# Patient Record
Sex: Male | Born: 1990 | Race: Black or African American | Hispanic: No | Marital: Single | Smoking: Former smoker
Health system: Southern US, Community
[De-identification: ages and names within clinical notes are randomized; demographics above are authoritative.]

## PROBLEM LIST (undated history)

## (undated) DIAGNOSIS — J45909 Unspecified asthma, uncomplicated: Secondary | ICD-10-CM

## (undated) HISTORY — PX: NO PAST SURGERIES: SHX2092

---
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
## 2010-06-03 ENCOUNTER — Ambulatory Visit: Payer: Self-pay | Admitting: Internal Medicine

## 2013-06-07 ENCOUNTER — Emergency Department: Payer: Self-pay | Admitting: Emergency Medicine

## 2014-05-29 ENCOUNTER — Other Ambulatory Visit: Payer: Self-pay

## 2014-05-29 ENCOUNTER — Emergency Department: Payer: Self-pay

## 2014-05-29 ENCOUNTER — Encounter: Payer: Self-pay | Admitting: Emergency Medicine

## 2014-05-29 ENCOUNTER — Emergency Department
Admission: EM | Admit: 2014-05-29 | Discharge: 2014-05-29 | Disposition: A | Discharge status: Home / Self Care | Payer: Self-pay | Attending: Emergency Medicine | Admitting: Emergency Medicine

## 2014-05-29 DIAGNOSIS — Z720 Tobacco use: Secondary | ICD-10-CM | POA: Insufficient documentation

## 2014-05-29 DIAGNOSIS — R0789 Other chest pain: Secondary | ICD-10-CM | POA: Insufficient documentation

## 2014-05-29 DIAGNOSIS — J4520 Mild intermittent asthma, uncomplicated: Secondary | ICD-10-CM

## 2014-05-29 DIAGNOSIS — J45909 Unspecified asthma, uncomplicated: Secondary | ICD-10-CM | POA: Insufficient documentation

## 2014-05-29 DIAGNOSIS — Z79899 Other long term (current) drug therapy: Secondary | ICD-10-CM | POA: Insufficient documentation

## 2014-05-29 HISTORY — DX: Unspecified asthma, uncomplicated: J45.909

## 2014-05-29 MED ORDER — BENZONATATE 100 MG PO CAPS: 100.0000 mg | ORAL_CAPSULE | Freq: Three times a day (TID) | ORAL | Status: DC | PRN

## 2014-05-29 MED ORDER — ALBUTEROL SULFATE HFA 108 (90 BASE) MCG/ACT IN AERS: 2.0000 | INHALATION_SPRAY | RESPIRATORY_TRACT | Status: DC | PRN

## 2014-05-29 MED ORDER — FLUTICASONE PROPIONATE 50 MCG/ACT NA SUSP: 1.0000 | Freq: Every day | NASAL | Status: DC

## 2014-05-29 MED ORDER — IPRATROPIUM-ALBUTEROL 0.5-2.5 (3) MG/3ML IN SOLN
3.0000 mL | Freq: Once | RESPIRATORY_TRACT | Status: AC
  Administered 2014-05-29: 3 mL via RESPIRATORY_TRACT

## 2014-05-29 MED ORDER — IPRATROPIUM-ALBUTEROL 0.5-2.5 (3) MG/3ML IN SOLN
RESPIRATORY_TRACT | Status: AC
  Filled 2014-05-29: qty 3

## 2014-05-29 NOTE — ED Provider Notes (Signed)
[facility] Emergency Department Provider Note? ____________________________________________ ? Time seen: 1015 ? I have reviewed the triage vital signs and the nursing notes. ________ HISTORY ? Chief Complaint Asthma  HPI  Randy Fujitaroy E Wolgamott Jr. is a 24 y.o. male reports to the ED with complaints of cough and chest tightness with shortness of breath since yesterday. Patient gives a remote history of asthma for which he has kept prn albuterol inhaler. He denies fevers, chills, sweats, nausea, vomiting, but does report a productive cough at this time.  Review of Systems  Constitutional: Negative for fever. Eyes: Negative for visual changes. ENT: Negative for sore throat. Cardiovascular: Negative for chest pain. Respiratory: Negative for shortness of breath. Gastrointestinal: Negative for abdominal pain, vomiting and diarrhea. Musculoskeletal: Negative for back pain. Skin: Negative for rash. Neurological: Negative for headaches, focal weakness or numbness.  10-point ROS otherwise negative. ____________________________________________  Past Medical History  Diagnosis Date  . Asthma    There are no active problems to display for this patient. ? History reviewed. No pertinent past surgical history. ? Current Outpatient Rx  Name  Route  Sig  Dispense  Refill  . albuterol (PROVENTIL HFA;VENTOLIN HFA) 108 (90 BASE) MCG/ACT inhaler   Inhalation   Inhale 1 puff into the lungs every 6 (six) hours as needed for wheezing or shortness of breath.         Marland Kitchen. albuterol (PROVENTIL HFA;VENTOLIN HFA) 108 (90 BASE) MCG/ACT inhaler   Inhalation   Inhale 2 puffs into the lungs every 4 (four) hours as needed for wheezing or shortness of breath.   1 Inhaler   0   . benzonatate (TESSALON PERLES) 100 MG capsule   Oral   Take 1 capsule (100 mg total) by mouth 3 (three) times daily as needed for cough (Take 1-2 per dose).   30 capsule   0   . fluticasone (FLONASE) 50  MCG/ACT nasal spray   Each Nare   Place 1 spray into both nostrils daily.   16 g   0   ? Allergies Review of patient's allergies indicates no known allergies. ? History reviewed. No pertinent family history. ? Social History History  Substance Use Topics  . Smoking status: Current Every Day Smoker -- 0.00 packs/day    Types: Cigarettes  . Smokeless tobacco: Not on file  . Alcohol Use: Not on file   PHYSICAL EXAM:  VITAL SIGNS: ED Triage Vitals  Enc Vitals Group     BP 05/29/14 0950 154/93 mmHg     Pulse Rate 05/29/14 0950 125     Resp 05/29/14 0950 20     Temp 05/29/14 0950 98 F (36.7 C)     Temp Source 05/29/14 0950 Oral     SpO2 05/29/14 0950 94 %     Weight 05/29/14 0950 183 lb (83.008 kg)     Height 05/29/14 0950 6\' 5"  (1.956 m)     Head Cir --      Peak Flow --      Pain Score 05/29/14 0951 10     Pain Loc --      Pain Edu? --      Excl. in GC? --    Constitutional: Alert and oriented. Well appearing and in no distress. Eyes: Conjunctivae are normal. PERRL. Normal extraocular movements. ENT   Head: Normocephalic and atraumatic.   Nose: No congestion/rhinnorhea.   Mouth/Throat: Mucous membranes are moist.      Ears: Normal external exam. Canals clear. TMs clear  bilaterally.   Neck: Supple. No lymphadenopathy. Cardiovascular: Normal rate, regular rhythm. Normal and symmetric distal pulses are present in all extremities. No murmurs, rubs, or gallops. Respiratory: Normal respiratory effort without tachypnea nor retractions. Breath sounds are clear and equal bilaterally. No wheezes/rales/rhonchi. Gastrointestinal: Soft and nontender.  Musculoskeletal: Normal range of motion in all extremities.  Neurologic:  Normal speech and language. No gross focal neurologic deficits are appreciated.  Skin:  Skin is warm, dry and intact. No rash noted. Psychiatric: Mood and affect are normal. Patient exhibits appropriate insight and judgment.  ___ EKG ED ECG  REPORT   Date: 05/29/2014  EKG Time: 9:54 AM  Rate: 101  Rhythm: sinus tachycardia  Axis: normal  Intervals:sinus tachycardia  ST&T Change: none  Narrative Interpretation: possible LA enlargement  ___________ RADIOLOGY   Chest XR IMPRESSION: No edema or consolidation. _____________ PROCEDURES ? Procedure(s) performed: DuoNeb x1  Critical Care performed: none  ______________________________________________________ INITIAL IMPRESSION / ASSESSMENT AND PLAN / ED COURSE ? CXR results to patient.  Exam consistent with bronchitis and allergy-induced bronchospasms. Prescription meds as directed. Follow-up with primary provider or return as needed.  Pertinent labs & imaging results that were available during my care of the patient were reviewed by me and considered in my medical decision making (see chart for details).  ____________________________________________ FINAL CLINICAL IMPRESSION(S) / ED DIAGNOSES?  Final diagnoses:  Allergic bronchitis, mild intermittent, uncomplicated      Lissa HoardJenise V Bacon Donnis Pecha, PA-C 05/29/14 1722  Sharyn CreamerMark Quale, MD 05/30/14 1655

## 2014-05-29 NOTE — ED Notes (Addendum)
Pt states he has hx of asthma and has had chest tightness and sob since yesterday, pt tachycardiac in triage and o2 sat 94% on RA, pt in no distress able to speak in complete sentences, states he also has had cough for the last couple of days, slight wheezes throughout all lobes, states he just used his inhaler prior to arrival

## 2014-05-29 NOTE — Discharge Instructions (Signed)
Bronchospasm °A bronchospasm is a spasm or tightening of the airways going into the lungs. During a bronchospasm breathing becomes more difficult because the airways get smaller. When this happens there can be coughing, a whistling sound when breathing (wheezing), and difficulty breathing. Bronchospasm is often associated with asthma, but not all patients who experience a bronchospasm have asthma. °CAUSES  °A bronchospasm is caused by inflammation or irritation of the airways. The inflammation or irritation may be triggered by:  °· Allergies (such as to animals, pollen, food, or mold). Allergens that cause bronchospasm may cause wheezing immediately after exposure or many hours later.   °· Infection. Viral infections are believed to be the most common cause of bronchospasm.   °· Exercise.   °· Irritants (such as pollution, cigarette smoke, strong odors, aerosol sprays, and paint fumes).   °· Weather changes. Winds increase molds and pollens in the air. Rain refreshes the air by washing irritants out. Cold air may cause inflammation.   °· Stress and emotional upset.   °SIGNS AND SYMPTOMS  °· Wheezing.   °· Excessive nighttime coughing.   °· Frequent or severe coughing with a simple cold.   °· Chest tightness.   °· Shortness of breath.   °DIAGNOSIS  °Bronchospasm is usually diagnosed through a history and physical exam. Tests, such as chest X-rays, are sometimes done to look for other conditions. °TREATMENT  °· Inhaled medicines can be given to open up your airways and help you breathe. The medicines can be given using either an inhaler or a nebulizer machine. °· Corticosteroid medicines may be given for severe bronchospasm, usually when it is associated with asthma. °HOME CARE INSTRUCTIONS  °· Always have a plan prepared for seeking medical care. Know when to call your health care provider and local emergency services (911 in the [place] Know where you can access local emergency care. °· Only take medicines as  directed by your health care provider. °· If you were prescribed an inhaler or nebulizer machine, ask your health care provider to explain how to use it correctly. Always use a spacer with your inhaler if you were given one. °· It is necessary to remain calm during an attack. Try to relax and breathe more slowly.  °· Control your home environment in the following ways:   °¨ Change your heating and air conditioning filter at least once a month.   °¨ Limit your use of fireplaces and wood stoves. °¨ Do not smoke and do not allow smoking in your home.   °¨ Avoid exposure to perfumes and fragrances.   °¨ Get rid of pests (such as roaches and mice) and their droppings.   °¨ Throw away plants if you see mold on them.   °¨ Keep your house clean and dust free.   °¨ Replace carpet with wood, tile, or vinyl flooring. Carpet can trap dander and dust.   °¨ Use allergy-proof pillows, mattress covers, and box spring covers.   °¨ Wash bed sheets and blankets every week in hot water and dry them in a dryer.   °¨ Use blankets that are made of polyester or cotton.   °¨ Wash hands frequently. °SEEK MEDICAL CARE IF:  °· You have muscle aches.   °· You have chest pain.   °· The sputum changes from clear or white to yellow, green, gray, or bloody.   °· The sputum you cough up gets thicker.   °· There are problems that may be related to the medicine you are given, such as a rash, itching, swelling, or trouble breathing.   °SEEK IMMEDIATE MEDICAL CARE IF:  °· You have worsening wheezing and coughing even   after taking your prescribed medicines.   You have increased difficulty breathing.   You develop severe chest pain. MAKE SURE YOU:   Understand these instructions.  Will watch your condition.  Will get help right away if you are not doing well or get worse. Document Released: 01/05/2003 Document Revised: 01/07/2013 Document Reviewed: 06/24/2012 [facility] Patient Information 2015 [employer] [place]. This information is not  intended to replace advice given to you by your health care provider. Make sure you discuss any questions you have with your health care provider.   Consider dosing an OTC allergy medicine daily for symptom relief. Use the inhaler as prescribed.  [facility]

## 2016-05-15 ENCOUNTER — Encounter: Payer: Self-pay | Admitting: *Deleted

## 2016-05-15 ENCOUNTER — Ambulatory Visit
Admission: EM | Admit: 2016-05-15 | Discharge: 2016-05-15 | Disposition: A | Discharge status: Home / Self Care | Payer: Self-pay | Attending: Family Medicine | Admitting: Family Medicine

## 2016-05-15 DIAGNOSIS — J301 Allergic rhinitis due to pollen: Secondary | ICD-10-CM

## 2016-05-15 DIAGNOSIS — R0602 Shortness of breath: Secondary | ICD-10-CM

## 2016-05-15 DIAGNOSIS — R062 Wheezing: Secondary | ICD-10-CM

## 2016-05-15 DIAGNOSIS — J9801 Acute bronchospasm: Secondary | ICD-10-CM

## 2016-05-15 MED ORDER — ALBUTEROL SULFATE HFA 108 (90 BASE) MCG/ACT IN AERS: 1.0000 | INHALATION_SPRAY | Freq: Four times a day (QID) | RESPIRATORY_TRACT | 0 refills | Status: DC | PRN

## 2016-05-15 NOTE — ED Provider Notes (Signed)
[facility]    CSN: [id number] Arrival date & time: 05/15/16  1643     History   Chief Complaint Chief Complaint  Patient presents with  . Shortness of Breath  . Wheezing    HPI Randy Franco. is a 26 y.o. male.   The history is provided by the patient.  Shortness of Breath  Associated symptoms: wheezing   Associated symptoms: no chest pain, no cough, no ear pain, no fever, no headaches, no PND, no rash, no sore throat, no sputum production and no swollen glands   Wheezing  Severity:  Mild Severity compared to prior episodes:  Similar Onset quality:  Sudden Duration:  2 days Timing:  Intermittent Progression:  Waxing and waning Chronicity:  Recurrent Context: exposure to allergen and pollens   Context: not animal exposure, not dust, not emotional upset, not exercise, not fumes, not medical treatments, not pet dander, not smoke exposure, not strong odors and not tartrazine   Relieved by:  None tried (states he had run out of his inhaler) Ineffective treatments:  None tried Associated symptoms: shortness of breath   Associated symptoms: no chest pain, no chest tightness, no cough, no ear pain, no fatigue, no fever, no foot swelling, no headaches, no orthopnea, no PND, no rash, no rhinorrhea, no sore throat, no sputum production, no stridor and no swollen glands   Risk factors: no prior ICU admissions and no prior intubations     Past Medical History:  Diagnosis Date  . Asthma     There are no active problems to display for this patient.   History reviewed. No pertinent surgical history.     Home Medications    Prior to Admission medications   Medication Sig Start Date End Date Taking? Authorizing Provider  albuterol (PROVENTIL HFA;VENTOLIN HFA) 108 (90 Base) MCG/ACT inhaler Inhale 1-2 puffs into the lungs every 6 (six) hours as needed for wheezing or shortness of breath. 05/15/16   Payton Mccallum, MD  benzonatate (TESSALON PERLES) 100 MG capsule  Take 1 capsule (100 mg total) by mouth 3 (three) times daily as needed for cough (Take 1-2 per dose). 05/29/14   Jenise V Bacon Menshew, PA-C  fluticasone (FLONASE) 50 MCG/ACT nasal spray Place 1 spray into both nostrils daily. 05/29/14   Jenise Marcelyn Bruins Menshew, PA-C    Family History History reviewed. No pertinent family history.  Social History Social History  Substance Use Topics  . Smoking status: Current Some Day Smoker    Packs/day: 0.00    Types: Cigarettes  . Smokeless tobacco: Never Used  . Alcohol use No     Allergies   Patient has no known allergies.   Review of Systems Review of Systems  Constitutional: Negative for fatigue and fever.  HENT: Negative for ear pain, rhinorrhea and sore throat.   Respiratory: Positive for shortness of breath and wheezing. Negative for cough, sputum production, chest tightness and stridor.   Cardiovascular: Negative for chest pain, orthopnea and PND.  Skin: Negative for rash.  Neurological: Negative for headaches.     Physical Exam Triage Vital Signs ED Triage Vitals  Enc Vitals Group     BP 05/15/16 1659 126/75     Pulse Rate 05/15/16 1659 79     Resp 05/15/16 1659 16     Temp 05/15/16 1659 98.5 F (36.9 C)     Temp Source 05/15/16 1659 Oral     SpO2 05/15/16 1659 98 %     Weight 05/15/16  1702 185 lb (83.9 kg)     Height 05/15/16 1702  (1.956 m)     Head Circumference --      Peak Flow --      Pain Score --      Pain Loc --      Pain Edu? --      Excl. in GC? --    No data found.   Updated Vital Signs BP 126/75 (BP Location: Left Arm)   Pulse 79   Temp 98.5 F (36.9 C) (Oral)   Resp 16   Ht  (1.956 m)   Wt 185 lb (83.9 kg)   SpO2 98%   BMI 21.94 kg/m   Visual Acuity Right Eye Distance:   Left Eye Distance:   Bilateral Distance:    Right Eye Near:   Left Eye Near:    Bilateral Near:     Physical Exam  Constitutional: He appears well-developed and well-nourished. No distress.  Neck: Neck  supple. No tracheal deviation present.  Cardiovascular: Normal rate, regular rhythm, normal heart sounds and intact distal pulses.   No murmur heard. Pulmonary/Chest: Effort normal and breath sounds normal. No respiratory distress. He has no wheezes. He has no rales. He exhibits no tenderness.  Musculoskeletal: He exhibits no edema.  Skin: He is not diaphoretic.     UC Treatments / Results  Labs (all labs ordered are listed, but only abnormal results are displayed) Labs Reviewed - No data to display  EKG  EKG Interpretation None       Radiology No results found.  Procedures Procedures (including critical care time)  Medications Ordered in UC Medications - No data to display   Initial Impression / Assessment and Plan / UC Course  I have reviewed the triage vital signs and the nursing notes.  Pertinent labs & imaging results that were available during my care of the patient were reviewed by me and considered in my medical decision making (see chart for details).      Final Clinical Impressions(s) / UC Diagnoses   Final diagnoses:  Bronchospasm  Wheezing  Seasonal allergic rhinitis due to pollen    New Prescriptions Discharge Medication List as of 05/15/2016  6:36 PM     1. diagnosis reviewed with patient 2. rx as per orders above; reviewed possible side effects, interactions, risks and benefits; rx for albuterol inhaler as per orders  3. Recommend supportive treatment with otc allergy medication   4. Follow-up prn if symptoms worsen or don't improve   Payton Mccallum, MD 05/15/16 [phone]

## 2016-05-15 NOTE — ED Triage Notes (Signed)
Dyspnea, and wheezing , onset today. Hx of asthma.

## 2016-12-01 IMAGING — DX DG CHEST 2V
1 series · 2 of 2 positions shown · non-contrast
Comparison: June 07, 2013

CLINICAL DATA: Left-sided chest pain and tightness

EXAM:
CHEST  2 VIEW

[Series 1: dg chest 2 view · 0.14mm/px · 2 of 2 slices shown]
[im 1/2]
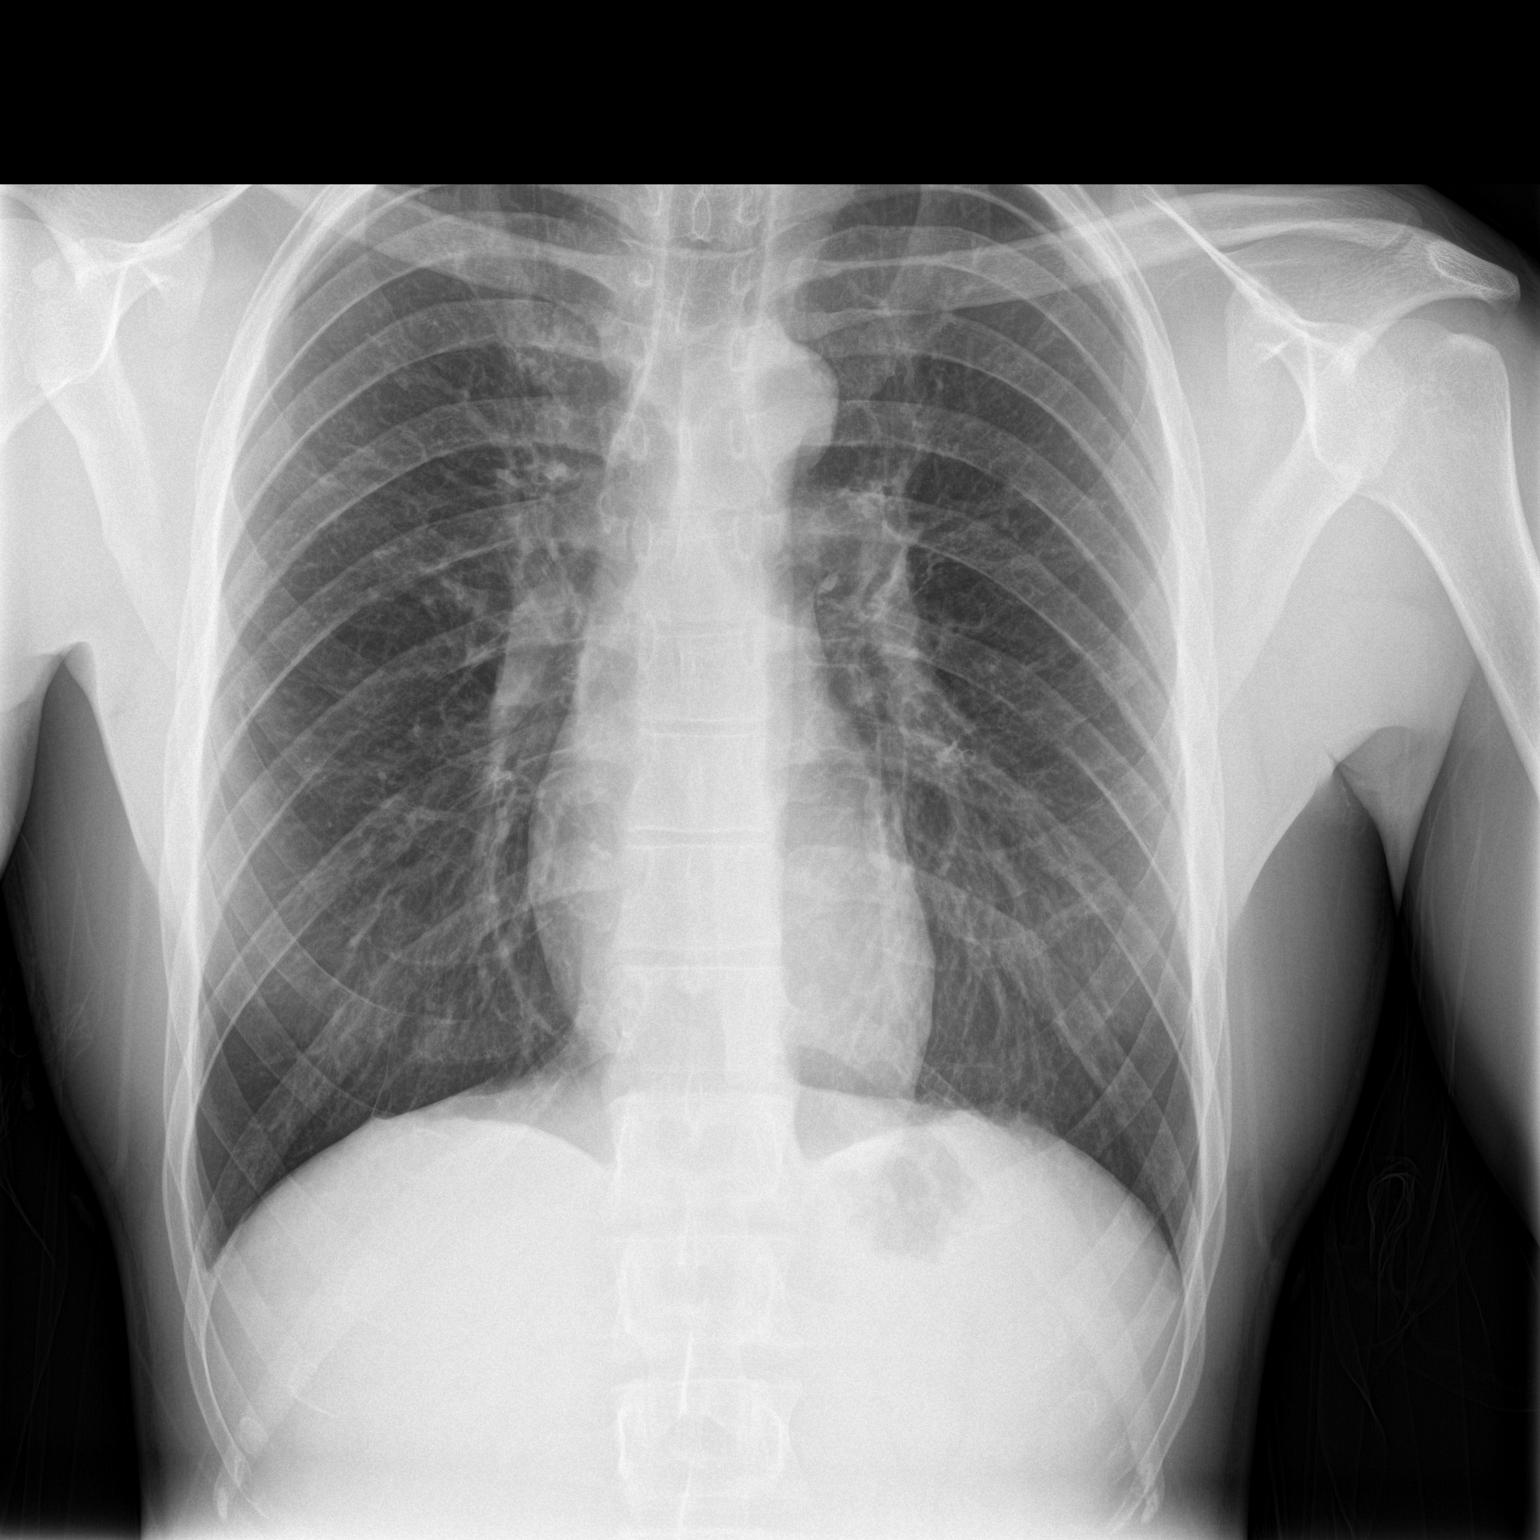
[im 2/2]
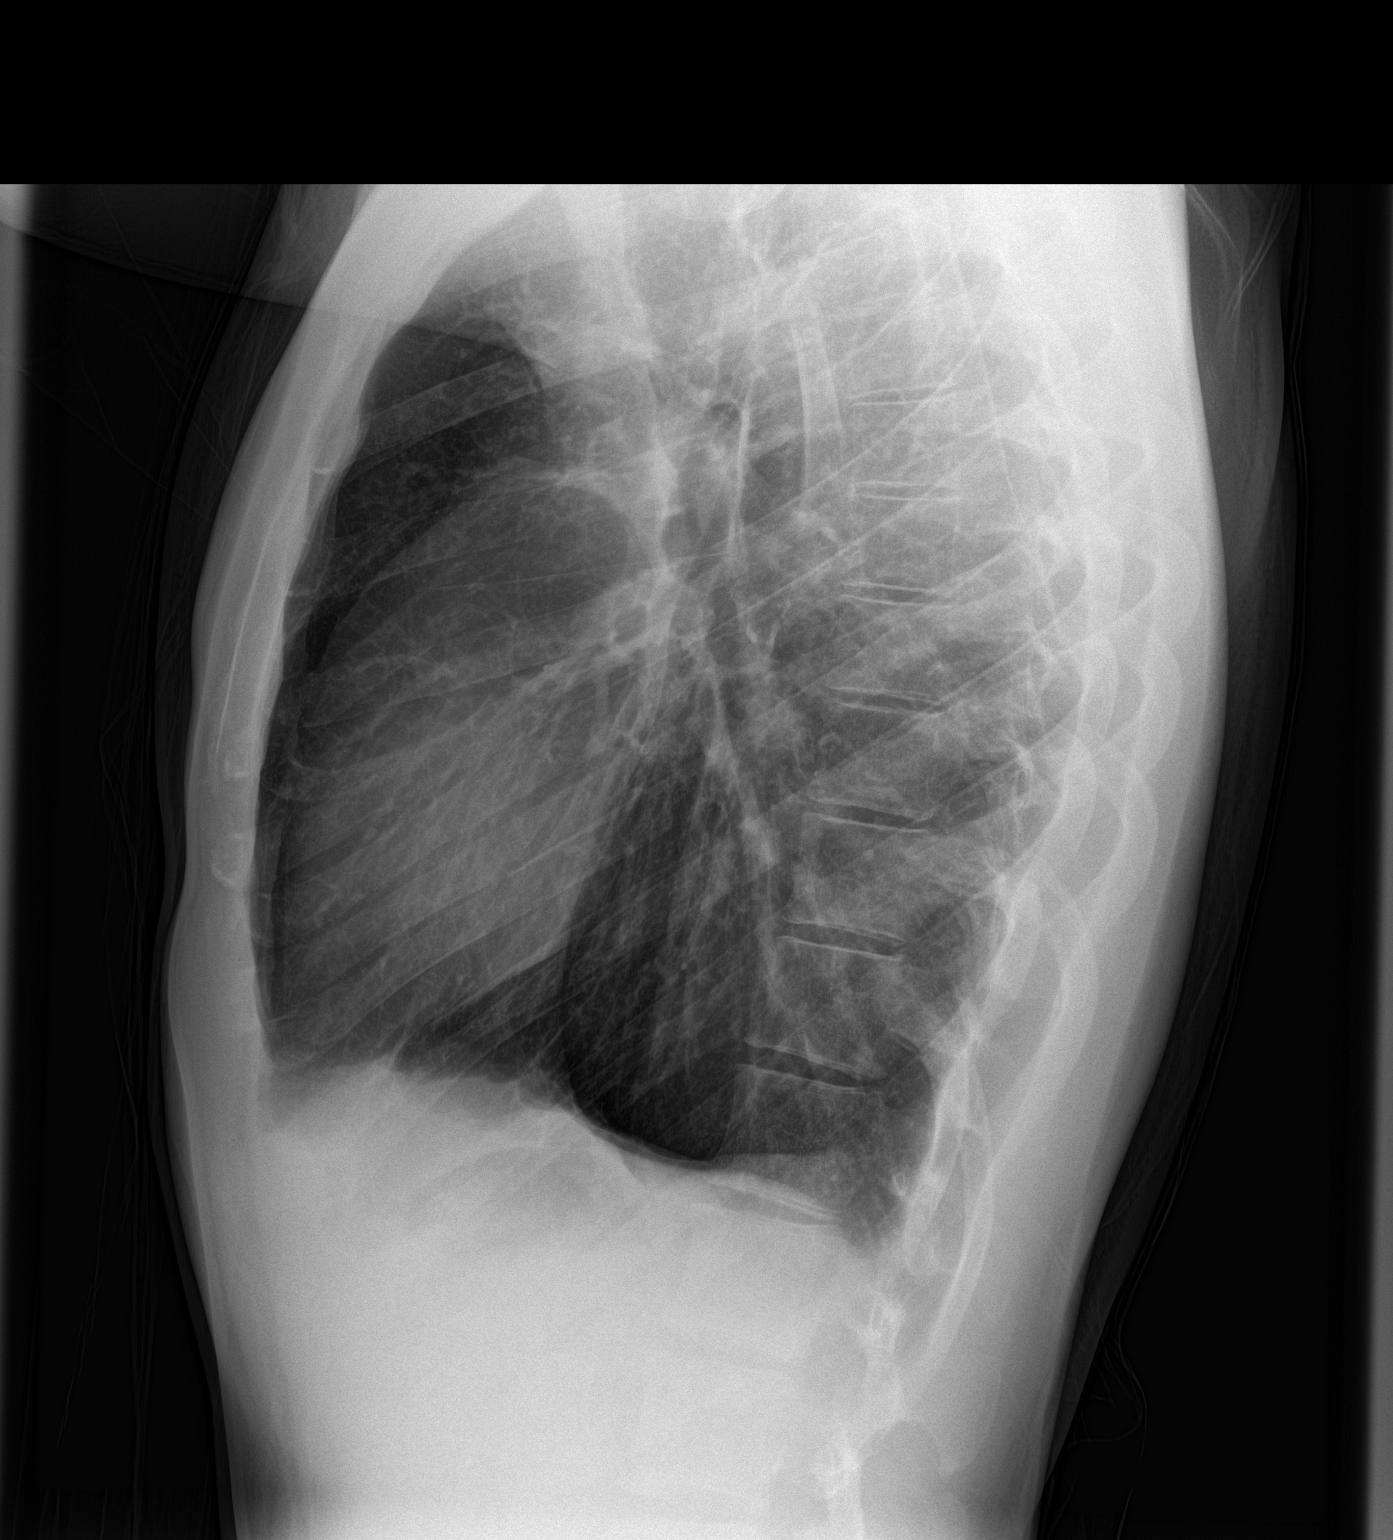

[2 of 2 positions shown; findings below may reference images not displayed]

FINDINGS: There is no edema or consolidation. The heart size and pulmonary
vascularity are normal. No adenopathy. No pneumothorax. No bone
lesions.
IMPRESSION: No edema or consolidation.

## 2019-05-24 ENCOUNTER — Ambulatory Visit
Admission: EM | Admit: 2019-05-24 | Discharge: 2019-05-24 | Disposition: A | Discharge status: Home / Self Care | Payer: Self-pay | Attending: Family Medicine | Admitting: Family Medicine

## 2019-05-24 ENCOUNTER — Other Ambulatory Visit: Payer: Self-pay

## 2019-05-24 DIAGNOSIS — J45901 Unspecified asthma with (acute) exacerbation: Secondary | ICD-10-CM

## 2019-05-24 MED ORDER — ALBUTEROL SULFATE HFA 108 (90 BASE) MCG/ACT IN AERS: 1.0000 | INHALATION_SPRAY | Freq: Four times a day (QID) | RESPIRATORY_TRACT | 3 refills | Status: DC | PRN

## 2019-05-24 MED ORDER — PREDNISONE 50 MG PO TABS: ORAL_TABLET | ORAL | 0 refills | Status: DC

## 2019-05-24 NOTE — ED Triage Notes (Signed)
Patient complains of sinus pain and pressure that has been constant for 10 days. States that he has felt like he has wheezing and has a history of asthma.

## 2019-05-24 NOTE — Discharge Instructions (Signed)
Medication as prescribed.  Take care  Dr. Ellieanna Funderburg  

## 2019-05-24 NOTE — ED Provider Notes (Signed)
[facility]    CSN: [id number] Arrival date & time: 05/24/19  1451  History   Chief Complaint Chief Complaint  Patient presents with  . Sinusitis   HPI  29 year old male presents with chest tightness.  Patient reports ongoing chest tightness.  He states that he is also had some sinus issues.  He states he has a history of asthma.  He has been wheezing.  He states that his symptoms have been worse as of last night.  Worse at night.  No relieving factors.  No other associated symptoms.  No other complaints.  Past Medical History:  Diagnosis Date  . Asthma    Past Surgical History:  Procedure Laterality Date  . NO PAST SURGERIES     Home Medications    Prior to Admission medications   Medication Sig Start Date End Date Taking? Authorizing Provider  fluticasone (FLONASE) 50 MCG/ACT nasal spray Place 1 spray into both nostrils daily. 05/29/14  Yes Menshew, Charlesetta Ivory, PA-C  albuterol (VENTOLIN HFA) 108 (90 Base) MCG/ACT inhaler Inhale 1-2 puffs into the lungs every 6 (six) hours as needed for wheezing or shortness of breath. 05/24/19   Tommie Sams, DO  predniSONE (DELTASONE) 50 MG tablet 1 tablet daily x 5 days 05/24/19   Tommie Sams, DO    Family History Family History  Problem Relation Age of Onset  . Healthy Mother   . Healthy Father     Social History Social History   Tobacco Use  . Smoking status: Former Smoker    Packs/day: 0.00    Types: Cigarettes  . Smokeless tobacco: Never Used  Substance Use Topics  . Alcohol use: No  . Drug use: Never     Allergies   Patient has no known allergies.   Review of Systems Review of Systems  HENT: Positive for congestion.   Respiratory: Positive for chest tightness.    Physical Exam Triage Vital Signs ED Triage Vitals  Enc Vitals Group     BP 05/24/19 1514 (!) 131/99     Pulse Rate 05/24/19 1514 (!) 106     Resp --      Temp 05/24/19 1514 98.9 F (37.2 C)     Temp Source 05/24/19 1514 Oral   SpO2 05/24/19 1514 100 %     Weight 05/24/19 1512 170 lb (77.1 kg)     Height 05/24/19 1512 6\' 5"  (1.956 m)     Head Circumference --      Peak Flow --      Pain Score 05/24/19 1512 7     Pain Loc --      Pain Edu? --      Excl. in GC? --    Updated Vital Signs BP (!) 131/99 (BP Location: Left Arm)   Pulse (!) 106   Temp 98.9 F (37.2 C) (Oral)   Ht 6\' 5"  (1.956 m)   Wt 77.1 kg   SpO2 100%   BMI 20.16 kg/m   Visual Acuity Right Eye Distance:   Left Eye Distance:   Bilateral Distance:    Right Eye Near:   Left Eye Near:    Bilateral Near:     Physical Exam Vitals and nursing note reviewed.  Constitutional:      General: He is not in acute distress.    Appearance: Normal appearance. He is not ill-appearing.  HENT:     Head: Normocephalic and atraumatic.     Mouth/Throat:     Pharynx:  Oropharynx is clear. No oropharyngeal exudate or posterior oropharyngeal erythema.  Eyes:     General:        Right eye: No discharge.        Left eye: No discharge.     Conjunctiva/sclera: Conjunctivae normal.  Cardiovascular:     Rate and Rhythm: Regular rhythm. Tachycardia present.     Heart sounds: No murmur.  Pulmonary:     Effort: Pulmonary effort is normal.     Breath sounds: Wheezing present.  Neurological:     Mental Status: He is alert.  Psychiatric:        Mood and Affect: Mood normal.        Behavior: Behavior normal.    UC Treatments / Results  Labs (all labs ordered are listed, but only abnormal results are displayed) Labs Reviewed - No data to display  EKG   Radiology No results found.  Procedures Procedures (including critical care time)  Medications Ordered in UC Medications - No data to display  Initial Impression / Assessment and Plan / UC Course  I have reviewed the triage vital signs and the nursing notes.  Pertinent labs & imaging results that were available during my care of the patient were reviewed by me and considered in my medical  decision making (see chart for details).    29 year old male presents with an asthma exacerbation.  Treating with albuterol and prednisone.  Final Clinical Impressions(s) / UC Diagnoses   Final diagnoses:  Asthma with acute exacerbation, unspecified asthma severity, unspecified whether persistent     Discharge Instructions     Medication as prescribed.  Take care  Dr. Lacinda Axon    ED Prescriptions    Medication Sig Dispense Auth. Provider   albuterol (VENTOLIN HFA) 108 (90 Base) MCG/ACT inhaler Inhale 1-2 puffs into the lungs every 6 (six) hours as needed for wheezing or shortness of breath. 18 g Blythe Veach G, DO   predniSONE (DELTASONE) 50 MG tablet 1 tablet daily x 5 days 5 tablet Thersa Salt G, DO     PDMP not reviewed this encounter.   Coral Spikes, [place] 05/24/19 1605

## 2020-05-31 ENCOUNTER — Other Ambulatory Visit: Payer: Self-pay

## 2020-05-31 ENCOUNTER — Encounter: Payer: Self-pay | Admitting: Emergency Medicine

## 2020-05-31 ENCOUNTER — Ambulatory Visit
Admission: EM | Admit: 2020-05-31 | Discharge: 2020-05-31 | Disposition: A | Discharge status: Home / Self Care | Payer: Self-pay | Attending: Emergency Medicine | Admitting: Emergency Medicine

## 2020-05-31 DIAGNOSIS — J4521 Mild intermittent asthma with (acute) exacerbation: Secondary | ICD-10-CM

## 2020-05-31 MED ORDER — ALBUTEROL SULFATE HFA 108 (90 BASE) MCG/ACT IN AERS: 1.0000 | INHALATION_SPRAY | Freq: Four times a day (QID) | RESPIRATORY_TRACT | 3 refills | Status: DC | PRN

## 2020-05-31 MED ORDER — PREDNISONE 20 MG PO TABS: 60.0000 mg | ORAL_TABLET | Freq: Every day | ORAL | 0 refills | Status: AC

## 2020-05-31 MED ORDER — AEROCHAMBER MV MISC: 2 refills | Status: DC

## 2020-05-31 NOTE — ED Triage Notes (Signed)
Patient states his Albuterol inhaler expired at the beginning of the month. He is requesting a refill on his Albuterol. He reports a cough that started a few months ago when his allergies started. He also is requesting Prednisone and states he received this last year when he was seen here for the same problem.

## 2020-05-31 NOTE — ED Provider Notes (Signed)
[facility]    CSN: [id number] Arrival date & time: 05/31/20  0931      History   Chief Complaint Chief Complaint  Patient presents with  . Asthma  . Medication Refill    HPI Randy Vantol. is a 30 y.o. male.   HPI   30 year old male here for evaluation of asthma symptoms.  Patient reports that every year when allergy season starts and he develops shortness of breath and wheezing similar to his asthma when he was a child.  Patient reports he has not had inhaler since he got out of high school but for the past 2 years he has had to come here to the urgent care to get prednisone and albuterol to help his symptoms.  He does not currently have a primary care physician.  Patient states that he also has a mild nonproductive cough but denies runny nose or nasal congestion.  Past Medical History:  Diagnosis Date  . Asthma     There are no problems to display for this patient.   Past Surgical History:  Procedure Laterality Date  . NO PAST SURGERIES         Home Medications    Prior to Admission medications   Medication Sig Start Date End Date Taking? Authorizing Provider  predniSONE (DELTASONE) 20 MG tablet Take 3 tablets (60 mg total) by mouth daily with breakfast for 5 days. 3 tablets by mouth daily with breakfast for 5 days. 05/31/20 06/05/20 Yes Becky Augusta, NP  Spacer/Aero-Holding Deretha Emory (AEROCHAMBER MV) inhaler Use as instructed 05/31/20  Yes Becky Augusta, NP  albuterol (VENTOLIN HFA) 108 (90 Base) MCG/ACT inhaler Inhale 1-2 puffs into the lungs every 6 (six) hours as needed for wheezing or shortness of breath. 05/31/20   Becky Augusta, NP  fluticasone (FLONASE) 50 MCG/ACT nasal spray Place 1 spray into both nostrils daily. 05/29/14 05/31/20  Menshew, Charlesetta Ivory, PA-C    Family History Family History  Problem Relation Age of Onset  . Healthy Mother   . Healthy Father     Social History Social History   Tobacco Use  . Smoking status: Former  Smoker    Packs/day: 0.00    Types: Cigarettes  . Smokeless tobacco: Never Used  Vaping Use  . Vaping Use: Never used  Substance Use Topics  . Alcohol use: No  . Drug use: Never     Allergies   Patient has no known allergies.   Review of Systems Review of Systems  Constitutional: Negative for activity change.  HENT: Negative for congestion and rhinorrhea.   Respiratory: Positive for cough, shortness of breath and wheezing.   Hematological: Negative.   Psychiatric/Behavioral: Negative.      Physical Exam Triage Vital Signs ED Triage Vitals [05/31/20 1006]  Enc Vitals Group     BP      Pulse      Resp      Temp      Temp src      SpO2      Weight 169 lb 15.6 oz (77.1 kg)     Height 6\' 5"  (1.956 m)     Head Circumference      Peak Flow      Pain Score 0     Pain Loc      Pain Edu?      Excl. in GC?    No data found.  Updated Vital Signs BP 121/84 (BP Location: Left Arm)   Pulse 86  Temp 98.1 F (36.7 C) (Oral)   Resp 18   Ht 6\' 5"  (1.956 m)   Wt 169 lb 15.6 oz (77.1 kg)   SpO2 98%   BMI 20.16 kg/m   Visual Acuity Right Eye Distance:   Left Eye Distance:   Bilateral Distance:    Right Eye Near:   Left Eye Near:    Bilateral Near:     Physical Exam Vitals and nursing note reviewed.  Constitutional:      General: He is not in acute distress.    Appearance: Normal appearance. He is normal weight. He is not ill-appearing.  HENT:     Head: Normocephalic and atraumatic.     Right Ear: Tympanic membrane, ear canal and external ear normal. There is no impacted cerumen.     Left Ear: Tympanic membrane, ear canal and external ear normal. There is no impacted cerumen.     Nose: Congestion and rhinorrhea present.     Mouth/Throat:     Mouth: Mucous membranes are moist.     Pharynx: Oropharynx is clear. No posterior oropharyngeal erythema.  Cardiovascular:     Rate and Rhythm: Normal rate and regular rhythm.     Pulses: Normal pulses.     Heart  sounds: Normal heart sounds. No murmur heard.   Pulmonary:     Effort: Pulmonary effort is normal.     Breath sounds: Wheezing present. No rhonchi or rales.  Musculoskeletal:     Cervical back: Normal range of motion and neck supple. No tenderness.  Lymphadenopathy:     Cervical: No cervical adenopathy.  Skin:    General: Skin is warm and dry.     Capillary Refill: Capillary refill takes less than 2 seconds.     Findings: No erythema or rash.  Neurological:     General: No focal deficit present.     Mental Status: He is alert and oriented to person, place, and time.  Psychiatric:        Mood and Affect: Mood normal.        Behavior: Behavior normal.        Thought Content: Thought content normal.        Judgment: Judgment normal.      UC Treatments / Results  Labs (all labs ordered are listed, but only abnormal results are displayed) Labs Reviewed - No data to display  EKG   Radiology No results found.  Procedures Procedures (including critical care time)  Medications Ordered in UC Medications - No data to display  Initial Impression / Assessment and Plan / UC Course  I have reviewed the triage vital signs and the nursing notes.  Pertinent labs & imaging results that were available during my care of the patient were reviewed by me and considered in my medical decision making (see chart for details).   Is an 30 year old male with a history of asthma here for evaluation of asthma type symptoms.  He reports that these have been going on since allergy season started back at the beginning of April.  His inhaler expired at the beginning of May and he reports that he has had shortness of breath with mild wheezing and a nonproductive cough.  Physical exam reveals mildly edematous nasal mucosa that is pale in nature with scant clear nasal discharge.  Oropharyngeal exam is benign.  Cardiopulmonary exam reveals scattered wheezes on exhalation.  We will treat patient with  albuterol inhaler with a spacer and prednisone 60 mg daily for 5  days.  Patient also advised that he should start taking some medication for his allergy symptoms as this may help control his asthma.  We will make a referral to help patient find a primary care as he does not have 1.   Final Clinical Impressions(s) / UC Diagnoses   Final diagnoses:  Mild intermittent asthma with exacerbation     Discharge Instructions     Use albuterol inhaler with a spacer, 2 puffs every 4-6 hours as needed for shortness of breath and wheezing.  Start the prednisone today and you will take 3 tablets each morning with breakfast for 5 days.  Begin taking over-the-counter Claritin, Zyrtec, or Allegra once daily to help control your allergy symptoms as this may to help you with your asthma.  I have made referral to help you find a primary care provider to help you with your asthma going forward.    ED Prescriptions    Medication Sig Dispense Auth. Provider   albuterol (VENTOLIN HFA) 108 (90 Base) MCG/ACT inhaler Inhale 1-2 puffs into the lungs every 6 (six) hours as needed for wheezing or shortness of breath. 18 g Becky Augusta, NP   Spacer/Aero-Holding Chambers (AEROCHAMBER MV) inhaler Use as instructed 1 each Becky Augusta, NP   predniSONE (DELTASONE) 20 MG tablet Take 3 tablets (60 mg total) by mouth daily with breakfast for 5 days. 3 tablets by mouth daily with breakfast for 5 days. 15 tablet Becky Augusta, NP     PDMP not reviewed this encounter.   Becky Augusta, NP 05/31/20 1024

## 2020-05-31 NOTE — Discharge Instructions (Signed)
Use albuterol inhaler with a spacer, 2 puffs every 4-6 hours as needed for shortness of breath and wheezing.  Start the prednisone today and you will take 3 tablets each morning with breakfast for 5 days.  Begin taking over-the-counter Claritin, Zyrtec, or Allegra once daily to help control your allergy symptoms as this may to help you with your asthma.  I have made referral to help you find a primary care provider to help you with your asthma going forward.

## 2021-02-05 ENCOUNTER — Ambulatory Visit
Admission: EM | Admit: 2021-02-05 | Discharge: 2021-02-05 | Disposition: A | Discharge status: Home / Self Care | Payer: Self-pay | Attending: Internal Medicine | Admitting: Internal Medicine

## 2021-02-05 ENCOUNTER — Encounter: Payer: Self-pay | Admitting: Emergency Medicine

## 2021-02-05 ENCOUNTER — Other Ambulatory Visit: Payer: Self-pay

## 2021-02-05 DIAGNOSIS — J4520 Mild intermittent asthma, uncomplicated: Secondary | ICD-10-CM

## 2021-02-05 MED ORDER — ALBUTEROL SULFATE HFA 108 (90 BASE) MCG/ACT IN AERS: 1.0000 | INHALATION_SPRAY | Freq: Four times a day (QID) | RESPIRATORY_TRACT | 0 refills | Status: DC | PRN

## 2021-02-05 MED ORDER — AEROCHAMBER MV MISC: 0 refills | Status: AC

## 2021-02-05 MED ORDER — BENZONATATE 100 MG PO CAPS: 100.0000 mg | ORAL_CAPSULE | Freq: Three times a day (TID) | ORAL | 0 refills | Status: DC

## 2021-02-05 NOTE — ED Triage Notes (Signed)
Patient states that he needs a refill on his inhaler.  Patient states that he has history of asthma.  Patient denies cough or SOB.

## 2021-02-05 NOTE — ED Provider Notes (Signed)
[facility]    CSN: [id number] Arrival date & time: 02/05/21  1211      History   Chief Complaint Chief Complaint  Patient presents with   Medication Refill    HPI Randy Franco. is a 31 y.o. male who presents requesting refill on his albuterol, tessalon and chamber. He does not have a PCP. Dusk triggers his cough. Has been out of his inhaler x 1 month. Denies feeling ill today, feels his normal. Had a cold 3 weeks ago and the cough is only on occasion.     Past Medical History:  Diagnosis Date   Asthma     There are no problems to display for this patient.   Past Surgical History:  Procedure Laterality Date   NO PAST SURGERIES         Home Medications    Prior to Admission medications   Medication Sig Start Date End Date Taking? Authorizing Provider  albuterol (VENTOLIN HFA) 108 (90 Base) MCG/ACT inhaler Inhale 1-2 puffs into the lungs every 6 (six) hours as needed for wheezing or shortness of breath. 02/05/21   Rodriguez-Southworth, Nettie Elm, PA-C  Spacer/Aero-Holding Chambers (AEROCHAMBER MV) inhaler Use as instructed 02/05/21   Rodriguez-Southworth, Nettie Elm, PA-C  fluticasone (FLONASE) 50 MCG/ACT nasal spray Place 1 spray into both nostrils daily. 05/29/14 05/31/20  Menshew, Charlesetta Ivory, PA-C    Family History Family History  Problem Relation Age of Onset   Healthy Mother    Healthy Father     Social History Social History   Tobacco Use   Smoking status: Former    Packs/day: 0.00    Types: Cigarettes   Smokeless tobacco: Never  Vaping Use   Vaping Use: Never used  Substance Use Topics   Alcohol use: No   Drug use: Never     Allergies   Patient has no known allergies.   Review of Systems Review of Systems  All other systems reviewed and are negative.   Physical Exam Triage Vital Signs ED Triage Vitals  Enc Vitals Group     BP 02/05/21 1235 (!) 141/89     Pulse Rate 02/05/21 1235 (!) 112     Resp 02/05/21 1235 15      Temp 02/05/21 1235 100 F (37.8 C)     Temp Source 02/05/21 1235 Oral     SpO2 02/05/21 1235 95 %     Weight 02/05/21 1234 169 lb 15.6 oz (77.1 kg)     Height 02/05/21 1234 6\' 5"  (1.956 m)     Head Circumference --      Peak Flow --      Pain Score 02/05/21 1233 0     Pain Loc --      Pain Edu? --      Excl. in GC? --    No data found.  Updated Vital Signs BP (!) 141/89 (BP Location: Right Arm)    Pulse (!) 112    Temp 100 F (37.8 C) (Oral)    Resp 15    Ht 6\' 5"  (1.956 m)    Wt 169 lb 15.6 oz (77.1 kg)    SpO2 95%    BMI 20.16 kg/m   Visual Acuity Right Eye Distance:   Left Eye Distance:   Bilateral Distance:    Right Eye Near:   Left Eye Near:    Bilateral Near:      Pulse ox was repeated and was 97 %  withou his mask  Physical Exam Vitals signs and nursing note reviewed.  Constitutional:      General: he is not in acute distress.    Appearance: Normal appearance. He is not ill-appearing, toxic-appearing or diaphoretic.  HENT:     Head: Normocephalic.     Right Ear: external ear normal.     Left Ear: external ear normal.     Nose: Nose normal.    Eyes:     General: No scleral icterus.       Right eye: No discharge.        Left eye: No discharge.     Conjunctiva/sclera: Conjunctivae normal.  Neck:     Musculoskeletal: Neck supple. No neck rigidity.  Cardiovascular:     Rate and Rhythm: Normal rate and regular rhythm.     Heart sounds: No murmur.  Pulmonary:     Effort: Pulmonary effort is normal.     Breath sounds: Normal breath sounds.  .  Musculoskeletal: Normal range of motion.  Lymphadenopathy:     Cervical: No cervical adenopathy.  Skin:    General: Skin is warm and dry.     Coloration: Skin is not jaundiced.     Findings: No rash.  Neurological:     Mental Status: he is alert and oriented to person, place, and time.     Gait: Gait normal.  Psychiatric:        Mood and Affect: Mood normal.        Behavior: Behavior normal.        Thought  Content: Thought content normal.        Judgment: Judgment normal.   UC Treatments / Results  Labs (all labs ordered are listed, but only abnormal results are displayed) Labs Reviewed - No data to display  EKG   Radiology No results found.  Procedures Procedures (including critical care time)  Medications Ordered in UC Medications - No data to display  Initial Impression / Assessment and Plan / UC Course  I have reviewed the triage vital signs and the nursing notes. Hx of asthma I refilled his Albuterol inhaler, Chamber, and tessalon. See instructions.      Final Clinical Impressions(s) / UC Diagnoses   Final diagnoses:  Mild intermittent asthma without complication   Discharge Instructions   None    ED Prescriptions     Medication Sig Dispense Auth. Provider   albuterol (VENTOLIN HFA) 108 (90 Base) MCG/ACT inhaler Inhale 1-2 puffs into the lungs every 6 (six) hours as needed for wheezing or shortness of breath. 18 g Rodriguez-Southworth, Nettie Elm, PA-C   Spacer/Aero-Holding Chambers (AEROCHAMBER MV) inhaler Use as instructed 1 each Rodriguez-Southworth, Nettie Elm, PA-C      PDMP not reviewed this encounter.   Garey Ham, [place] 02/05/21 1252

## 2021-02-05 NOTE — Discharge Instructions (Addendum)
Come back if you get worse cough, aches, shortness of breath.

## 2021-10-09 ENCOUNTER — Ambulatory Visit
Admission: EM | Admit: 2021-10-09 | Discharge: 2021-10-09 | Disposition: A | Discharge status: Home / Self Care | Payer: Self-pay | Attending: Emergency Medicine | Admitting: Emergency Medicine

## 2021-10-09 DIAGNOSIS — J4521 Mild intermittent asthma with (acute) exacerbation: Secondary | ICD-10-CM

## 2021-10-09 MED ORDER — ALBUTEROL SULFATE HFA 108 (90 BASE) MCG/ACT IN AERS: 1.0000 | INHALATION_SPRAY | Freq: Four times a day (QID) | RESPIRATORY_TRACT | 1 refills | Status: AC | PRN

## 2021-10-09 NOTE — ED Provider Notes (Signed)
[facility]    CSN: [id number] Arrival date & time: 10/09/21  1540      History   Chief Complaint Chief Complaint  Patient presents with   Asthma    HPI Randy Franco. is a 31 y.o. male.   HPI  31 year old male here requesting refill his albuterol inhaler.  Patient has a history of asthma, but does not have a primary care provider, and he typically uses albuterol with a spacer 12 point with the symptoms.  He states that about once a year he will have issues with his breathing.  He does not smoke but he is neck smoker.  He does also endorse some allergy symptoms and he states that he will on occasion use Benadryl and he has use Zyrtec once as well as Allegra but is not taking anything currently.  He denies cough but does endorse intermittent wheezing.  The wheezing started approximately 3 hours ago.  Patient is not in any respiratory distress.  Past Medical History:  Diagnosis Date   Asthma     There are no problems to display for this patient.   Past Surgical History:  Procedure Laterality Date   NO PAST SURGERIES         Home Medications    Prior to Admission medications   Medication Sig Start Date End Date Taking? Authorizing Provider  albuterol (VENTOLIN HFA) 108 (90 Base) MCG/ACT inhaler Inhale 1-2 puffs into the lungs every 6 (six) hours as needed for wheezing or shortness of breath. 10/09/21   Becky Augusta, NP  Spacer/Aero-Holding Deretha Emory (AEROCHAMBER MV) inhaler Use as instructed 02/05/21   Rodriguez-Southworth, Nettie Elm, PA-C  fluticasone [facility]) 50 MCG/ACT nasal spray Place 1 spray into both nostrils daily. 05/29/14 05/31/20  Menshew, Charlesetta Ivory, PA-C    Family History Family History  Problem Relation Age of Onset   Healthy Mother    Healthy Father     Social History Social History   Tobacco Use   Smoking status: Former    Packs/day: 0.00    Types: Cigarettes   Smokeless tobacco: Never  Vaping Use   Vaping Use: Never used   Substance Use Topics   Alcohol use: No   Drug use: Never     Allergies   Patient has no known allergies.   Review of Systems Review of Systems  Respiratory:  Positive for cough and wheezing. Negative for shortness of breath.      Physical Exam Triage Vital Signs ED Triage Vitals  Enc Vitals Group     BP      Pulse      Resp      Temp      Temp src      SpO2      Weight      Height      Head Circumference      Peak Flow      Pain Score      Pain Loc      Pain Edu?      Excl. in GC?    No data found.  Updated Vital Signs BP 120/88 (BP Location: Left Arm)   Pulse 95   Temp 98 F (36.7 C) (Oral)   Resp 18   Ht 6\' 5"  (1.956 m)   Wt 170 lb (77.1 kg)   SpO2 95%   BMI 20.16 kg/m   Visual Acuity Right Eye Distance:   Left Eye Distance:   Bilateral Distance:    Right  Eye Near:   Left Eye Near:    Bilateral Near:     Physical Exam Vitals and nursing note reviewed.  Constitutional:      Appearance: Normal appearance. He is not ill-appearing.  HENT:     Head: Normocephalic and atraumatic.  Cardiovascular:     Rate and Rhythm: Normal rate and regular rhythm.     Pulses: Normal pulses.     Heart sounds: Normal heart sounds. No murmur heard.    No friction rub. No gallop.  Pulmonary:     Effort: Pulmonary effort is normal.     Breath sounds: Normal breath sounds. No wheezing, rhonchi or rales.  Skin:    General: Skin is warm and dry.     Capillary Refill: Capillary refill takes less than 2 seconds.     Findings: No erythema or rash.  Neurological:     General: No focal deficit present.     Mental Status: He is alert and oriented to person, place, and time.  Psychiatric:        Mood and Affect: Mood normal.        Behavior: Behavior normal.        Thought Content: Thought content normal.        Judgment: Judgment normal.      UC Treatments / Results  Labs (all labs ordered are listed, but only abnormal results are displayed) Labs Reviewed -  No data to display  EKG   Radiology No results found.  Procedures Procedures (including critical care time)  Medications Ordered in UC Medications - No data to display  Initial Impression / Assessment and Plan / UC Course  I have reviewed the triage vital signs and the nursing notes.  Pertinent labs & imaging results that were available during my care of the patient were reviewed by me and considered in my medical decision making (see chart for details).   Patient is a nontoxic-appearing 31 year old male here for evaluation of chest tightness and wheezing that started approximately 3 hours ago.  He is not having any shortness of breath or cough.  He is able to speak in full sentences without any dyspnea or tachypnea.  His cardiopulmonary exam reveals clear lung sounds in all fields.  Patient does endorse that he coughs occasionally and brings up phlegm and that that is an ongoing issue.  He also endorses episodic allergy symptoms and states he usually has a problem around this time every year.  He is not currently taking any allergy medication he does not have a PCP.  I advised the patient to start taking with over-the-counter allergy medications to help with allergy symptoms and I will refill his albuterol inhaler.  He states he still has a spacer and he uses it when he has an inhaler.  I have encouraged the patient to continue to do so.  We will have staff establish a primary care visit for the patient prior to his discharge.  I have informed the patient that he needs to make and keep that appointment so that he can have someone to manage his asthma going forward.   Final Clinical Impressions(s) / UC Diagnoses   Final diagnoses:  Mild intermittent asthma with acute exacerbation     Discharge Instructions      Use the Albuterol inhaler with the spacer, 1-2 puffs every 4-6 hours, as needed for shortness of breath or wheezing.  You need to establish a primary care provider to help  you manage your asthma  going forward.  You can take OTC Allegra, Zyrtec, or Claritin as needed for allergy symptoms.      ED Prescriptions     Medication Sig Dispense Auth. Provider   albuterol (VENTOLIN HFA) 108 (90 Base) MCG/ACT inhaler Inhale 1-2 puffs into the lungs every 6 (six) hours as needed for wheezing or shortness of breath. 18 g Margarette Canada, NP      PDMP not reviewed this encounter.   Margarette Canada, NP 10/09/21 [phone]

## 2021-10-09 NOTE — Discharge Instructions (Addendum)
Use the Albuterol inhaler with the spacer, 1-2 puffs every 4-6 hours, as needed for shortness of breath or wheezing.  You need to establish a primary care provider to help you manage your asthma going forward.  You can take OTC Allegra, Zyrtec, or Claritin as needed for allergy symptoms.

## 2021-10-09 NOTE — ED Triage Notes (Signed)
Patient reports asthma and have been having difficulty breathing today, ran out of inhaler.

## 2023-01-05 ENCOUNTER — Other Ambulatory Visit: Payer: Self-pay

## 2023-01-05 ENCOUNTER — Emergency Department
Admission: EM | Admit: 2023-01-05 | Discharge: 2023-01-05 | Disposition: A | Discharge status: Home / Self Care | Payer: BLUE CROSS/BLUE SHIELD | Attending: Emergency Medicine | Admitting: Emergency Medicine

## 2023-01-05 DIAGNOSIS — R300 Dysuria: Secondary | ICD-10-CM | POA: Insufficient documentation

## 2023-01-05 DIAGNOSIS — J45909 Unspecified asthma, uncomplicated: Secondary | ICD-10-CM | POA: Insufficient documentation

## 2023-01-05 DIAGNOSIS — F410 Panic disorder [episodic paroxysmal anxiety] without agoraphobia: Secondary | ICD-10-CM | POA: Diagnosis present

## 2023-01-05 DIAGNOSIS — F439 Reaction to severe stress, unspecified: Secondary | ICD-10-CM | POA: Diagnosis not present

## 2023-01-05 LAB — URINALYSIS, ROUTINE W REFLEX MICROSCOPIC
Bacteria, UA: NONE SEEN
Bilirubin Urine: NEGATIVE
Glucose, UA: NEGATIVE mg/dL
Ketones, ur: 5 mg/dL — AB
Leukocytes,Ua: NEGATIVE
Nitrite: NEGATIVE
Protein, ur: 30 mg/dL — AB
Specific Gravity, Urine: 1.015 (ref 1.005–1.030)
pH: 5 (ref 5.0–8.0)

## 2023-01-05 NOTE — ED Triage Notes (Signed)
First nurse note: Pt to ED via [facility] from home. Family states  episode of SOB and tingling in fingers. EMS reports possible panic attack with increased life stressors. Pt denies SOB or tingling on arrival.   100% 93 HR  123/76

## 2023-01-05 NOTE — ED Notes (Signed)
Patient is still unable to void. Patient has been drinking water, but requested another cup which he was given. Visitor at bedside.

## 2023-01-05 NOTE — ED Notes (Signed)
Patient is unable to void at this time. Patient is calm, cooperative.

## 2023-01-05 NOTE — ED Provider Notes (Signed)
[facility] Provider Note    Event Date/Time   First MD Initiated Contact with Patient 01/05/23 1037     (approximate)   History   No chief complaint on file.   HPI  Randy Franco. is a 32 y.o. male with history of asthma presents emergency department for a panic attack.  Patient states that he had episode of shortness of breath and tingling in his fingers.  Has increased life stressors.  Denies chest pain or shortness of breath this time.  No vomiting diarrhea.  No abdominal pain.  States he does have some dysuria but this happens often.  No concerns for STD      Physical Exam   Triage Vital Signs: ED Triage Vitals  Encounter Vitals Group     BP 01/05/23 1016 113/62     Systolic BP Percentile --      Diastolic BP Percentile --      Pulse Rate 01/05/23 1016 90     Resp 01/05/23 1016 19     Temp 01/05/23 1016 97.6 F (36.4 C)     Temp Source 01/05/23 1016 Oral     SpO2 01/05/23 1016 100 %     Weight 01/05/23 1017 175 lb (79.4 kg)     Height 01/05/23 1017 6\' 5"  (1.956 m)     Head Circumference --      Peak Flow --      Pain Score 01/05/23 1018 0     Pain Loc --      Pain Education --      Exclude from Growth Chart --     Most recent vital signs: Vitals:   01/05/23 1016  BP: 113/62  Pulse: 90  Resp: 19  Temp: 97.6 F (36.4 C)  SpO2: 100%     General: Awake, no distress.   CV:  Good peripheral perfusion. regular rate and  rhythm Resp:  Normal effort. Lungs CTA Abd:  No distention.   Other:      ED Results / Procedures / Treatments   Labs (all labs ordered are listed, but only abnormal results are displayed) Labs Reviewed  URINALYSIS, ROUTINE W REFLEX MICROSCOPIC - Abnormal; Notable for the following components:      Result Value   Color, Urine YELLOW (*)    APPearance CLOUDY (*)    Hgb urine dipstick SMALL (*)    Ketones, ur 5 (*)    Protein, ur 30 (*)    All other components within normal limits      EKG     RADIOLOGY     PROCEDURES:   Procedures   MEDICATIONS ORDERED IN ED: Medications - No data to display   IMPRESSION / MDM / ASSESSMENT AND PLAN / ED COURSE  I reviewed the triage vital signs and the nursing notes.                              Differential diagnosis includes, but is not limited to, anxiety, panic attack, UTI  Patient's presentation is most consistent with acute complicated illness / injury requiring diagnostic workup.   I did explain to the patient since he is feeling better, I feel this was a panic attack he can follow-up with [facility] for some counseling.  Will do UA to ensure he does not have a UTI   UA reassuring  Patient discharged, instructed follow-up at [facility] as he is not suicidal or  homicidal   FINAL CLINICAL IMPRESSION(S) / ED DIAGNOSES   Final diagnoses:  Panic attack     Rx / DC Orders   ED Discharge Orders     None        Note:  This document was prepared using Dragon voice recognition software and may include unintentional dictation errors.    Faythe Ghee, PA-C 01/05/23 1442    Sharyn Creamer, MD 01/05/23 1539
